# Patient Record
Sex: Male | Born: 2000
Health system: Southern US, Community
[De-identification: ages and names within clinical notes are randomized; demographics above are authoritative.]

## PROBLEM LIST (undated history)

## (undated) DIAGNOSIS — R519 Headache, unspecified: Secondary | ICD-10-CM

## (undated) HISTORY — DX: Headache, unspecified: R51.9

---
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
## 2021-06-21 ENCOUNTER — Encounter: Payer: BC Managed Care – PPO | Admitting: Medical-Surgical

## 2021-09-28 ENCOUNTER — Ambulatory Visit: Payer: BC Managed Care – PPO | Admitting: Physician Assistant

## 2021-09-28 ENCOUNTER — Encounter: Payer: Self-pay | Admitting: Medical-Surgical

## 2021-09-28 ENCOUNTER — Telehealth (INDEPENDENT_AMBULATORY_CARE_PROVIDER_SITE_OTHER): Payer: BC Managed Care – PPO | Admitting: Medical-Surgical

## 2021-09-28 DIAGNOSIS — F418 Other specified anxiety disorders: Secondary | ICD-10-CM

## 2021-09-28 DIAGNOSIS — Z7689 Persons encountering health services in other specified circumstances: Secondary | ICD-10-CM

## 2021-09-28 MED ORDER — BUPROPION HCL ER (XL) 150 MG PO TB24: 150.0000 mg | ORAL_TABLET | ORAL | 0 refills | Status: DC

## 2021-09-28 NOTE — Progress Notes (Signed)
Virtual Visit via Video Note  I connected with Jacob Thompson on 09/28/21 at 11:10 AM EDT by a video enabled telemedicine application and verified that I am speaking with the correct person using two identifiers.   I discussed the limitations of evaluation and management by telemedicine and the availability of in person appointments. The patient expressed understanding and agreed to proceed.  Patient location: home Provider locations: office  Subjective:    CC: Transfer of care, discussed mood  HPI: Very pleasant 21 year old male presenting today via [place] video visit to transfer care to a new PCP and to discuss concerns with anxiety/depression.  He was treated in the past with fluoxetine 10 mg daily however he notes that he probably did not take this long enough to make any difference.  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  Notes that there are times when he does feel overwhelmed and he is struggling a little bit with the transition to college life.  Feels that his symptoms are more on the depression side as he has little interest and pleasure in doing things, poor appetite, and sleep that is not as satisfying as he would prefer.  Notes that he has counseling available at his college and he has had a couple of appointments that he found helpful.  His very busy schedule does interfere with his ability to go to counseling sessions so he has not been more often.  He is interested in starting medication that may help with his symptoms.  Denies SI/HI.  Of note he also has a history of ADHD and reports that he used to be treated with Adderall.  He felt that the medication worked however as he got older, he noted a natural improvement in his ability to focus and get things done.  He stopped taking the medication and is doing well overall.  Past medical history, Surgical history, Family history not pertinant except as noted below, Social history,  Allergies, and medications have been entered into the medical record, reviewed, and corrections made.   Review of Systems: See HPI for pertinent positives and negatives.      09/28/2021   10:49 AM 09/28/2021   10:48 AM 09/28/2021   10:43 AM 01/20/2020   11:16 AM 04/13/2019    2:06 PM  Depression screen PHQ 2/9  Decreased Interest 2   1 0  Down, Depressed, Hopeless 2   1 0  PHQ - 2 Score 4   2 0  Altered sleeping 1 1     Tired, decreased energy 2 2     Change in appetite 1 1 1     Feeling bad or failure about yourself  2 2     Trouble concentrating 1 1 1     Moving slowly or fidgety/restless 2 2     Suicidal thoughts 0 0     PHQ-9 Score 13      Difficult doing work/chores Somewhat difficult          09/28/2021   10:49 AM  GAD 7 : Generalized Anxiety Score  Nervous, Anxious, on Edge 2  Control/stop worrying 1  Worry too much - different things 1  Trouble relaxing 1  Restless 1  Easily annoyed or irritable 0  Afraid - awful might happen 0  Total GAD 7 Score 6  Anxiety Difficulty Somewhat difficult   Objective:    General: Speaking clearly in complete sentences without any shortness of breath.  Alert and oriented x3.  Normal judgment. No apparent acute distress.  Impression and Recommendations:    1. Encounter to establish care Reviewed available information and discussed care concerns with patient.   2. Depression with anxiety Discussed various options for management of mood issues.  He does seem to be struggling with the transition into a new role in life as well as the responsibilities of college and playing sports.  Reviewed using fluoxetine/Lexapro as a daily medication as he took this in the past.  Also reviewed the option for Wellbutrin as this seems to be a good option for him.  After reviewing use of the potential side effects and expectations of the medication, he would like to start Wellbutrin.  Sending in 150 mg daily.  Advised that this medication may cause an  increase in his anxiety over the next couple of weeks but this should slowly resolve.  If he should have any concerns arise between now and his next follow-up, advised him to reach out to [place] immediately.  Would strongly recommend continuing counseling since this has been beneficial.   I discussed the assessment and treatment plan with the patient. The patient was provided an opportunity to ask questions and all were answered. The patient agreed with the plan and demonstrated an understanding of the instructions.   The patient was advised to call back or seek an in-person evaluation if the symptoms worsen or if the condition fails to improve as anticipated.  25 minutes of non-face-to-face time was provided during this encounter.  Return for Mood follow-up in 4-6 weeks okay to be virtual.  [name], DNP, APRN, FNP-BC [facility] and Sports Medicine

## 2021-11-09 ENCOUNTER — Ambulatory Visit: Payer: BC Managed Care – PPO | Admitting: Medical-Surgical

## 2021-11-20 ENCOUNTER — Telehealth (INDEPENDENT_AMBULATORY_CARE_PROVIDER_SITE_OTHER): Payer: BC Managed Care – PPO | Admitting: Medical-Surgical

## 2021-11-20 ENCOUNTER — Encounter: Payer: Self-pay | Admitting: Medical-Surgical

## 2021-11-20 DIAGNOSIS — F418 Other specified anxiety disorders: Secondary | ICD-10-CM

## 2021-11-20 NOTE — Progress Notes (Signed)
Virtual Visit via Video Note  I connected with Jacob Thompson on 11/20/21 at 10:30 AM EST by a video enabled telemedicine application and verified that I am speaking with the correct person using two identifiers.   I discussed the limitations of evaluation and management by telemedicine and the availability of in person appointments. The patient expressed understanding and agreed to proceed.  Patient location: home Provider locations: office  Subjective:    CC: mood follow up  HPI: Pleasant 21 year old male presenting via MyChart video visit to follow-up on mood.  Over the last 4-5 weeks he has been taking Wellbutrin 150 mg daily, tolerating well without side effects.  Feels that the medication has not been helpful and has not noticed a difference in his symptoms.  He continues to have significant issues with depression as well as some anxiety.  He is doing counseling with college 2-3 times weekly which she reports is very beneficial.  He does endorse continued thoughts of self-harm however reports no plan or intention to follow through with these thoughts.  Is unable to identify a particular trigger for thoughts of self-harm as it happens throughout the day every day during normal activities. Denies homicidal ideation.  Past medical history, Surgical history, Family history not pertinant except as noted below, Social history, Allergies, and medications have been entered into the medical record, reviewed, and corrections made.   Review of Systems: See HPI for pertinent positives and negatives.   Objective:    General: Speaking clearly in complete sentences without any shortness of breath.  Alert and oriented x3.  Normal judgment. No apparent acute distress.  Impression and Recommendations:    1. Depression with anxiety Discussed recommendations for treatment and options available.  He would like to adjust the Wellbutrin so increasing his dose to 300 mg daily.  New supply sent to the  pharmacy but advised that he can take 2 of the 150 mg tablets to use the supply he has on hand.  Discussed possibly adding an SSRI but he would like to see how the Wellbutrin does before committing to adding a second agent.  Recommend continuing counseling.  Patient advised to monitor for any worsening thoughts of self-harm or the development of a plan to follow through and notify our office as well as his counselor immediately.  I discussed the assessment and treatment plan with the patient. The patient was provided an opportunity to ask questions and all were answered. The patient agreed with the plan and demonstrated an understanding of the instructions.   The patient was advised to call back or seek an in-person evaluation if the symptoms worsen or if the condition fails to improve as anticipated.  25 minutes of non-face-to-face time was provided during this encounter.  Return in about 6 weeks (around 01/01/2022) for mood follow up (virtual is fine).  I will send him a MyChart message in about 3 weeks to touch base and see how symptoms are going on the new dose of Wellbutrin.  [name], DNP, APRN, FNP-BC [facility] and Sports Medicine

## 2021-11-23 ENCOUNTER — Telehealth: Payer: BC Managed Care – PPO | Admitting: Medical-Surgical

## 2021-12-12 ENCOUNTER — Encounter: Payer: Self-pay | Admitting: Medical-Surgical

## 2021-12-19 ENCOUNTER — Other Ambulatory Visit: Payer: Self-pay | Admitting: Medical-Surgical

## 2021-12-30 IMAGING — DX DG KNEE COMPLETE 4+V*R*
4 series · 4 of 4 positions shown · non-contrast
Comparison: None.

CLINICAL DATA: Chronic bilateral knee pain

EXAM:
RIGHT KNEE - COMPLETE 4+ VIEW

[knee lat]
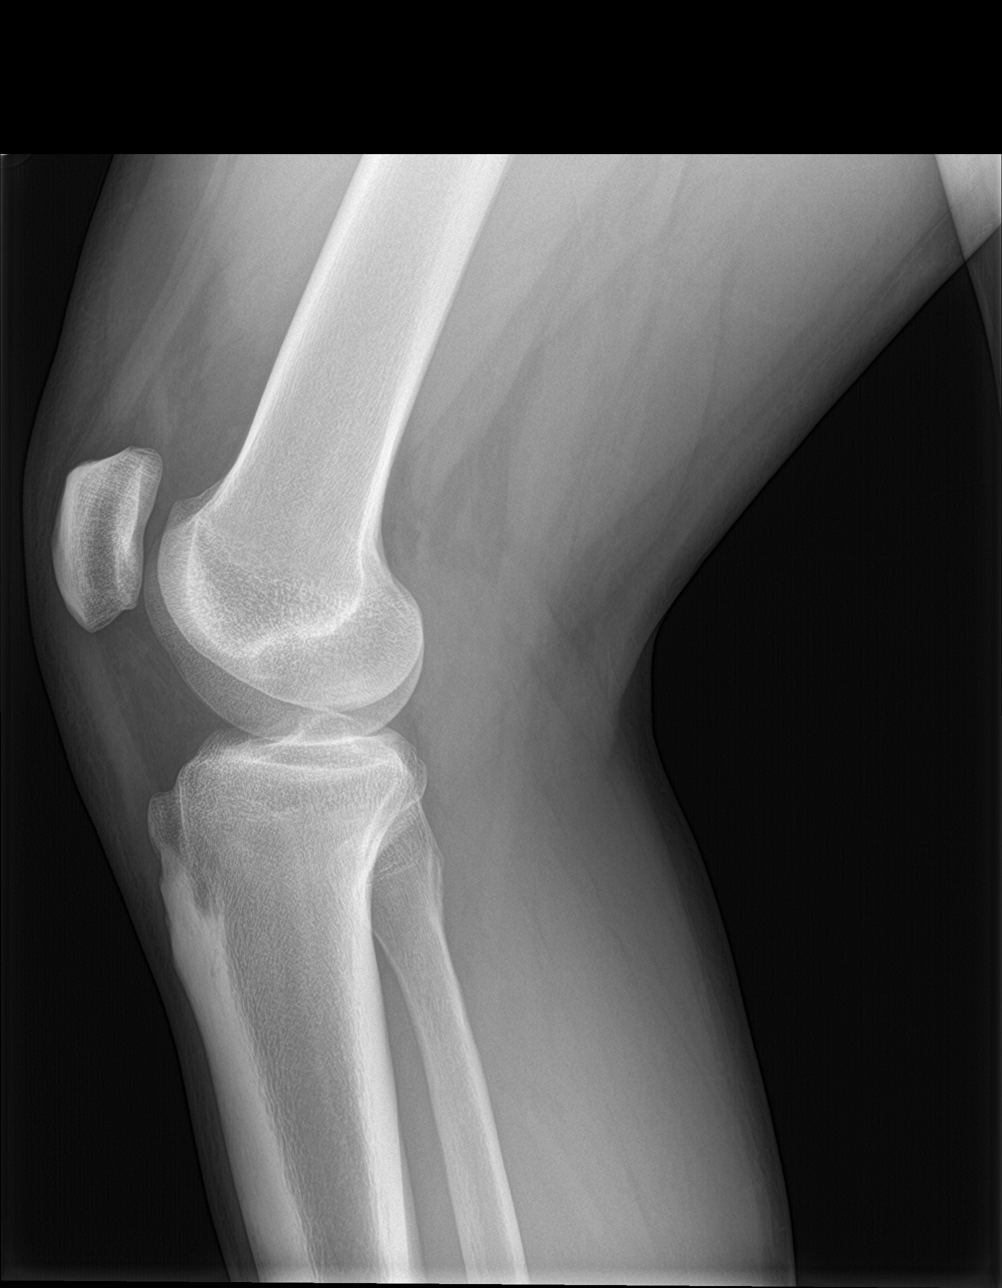

[knee sunrise]
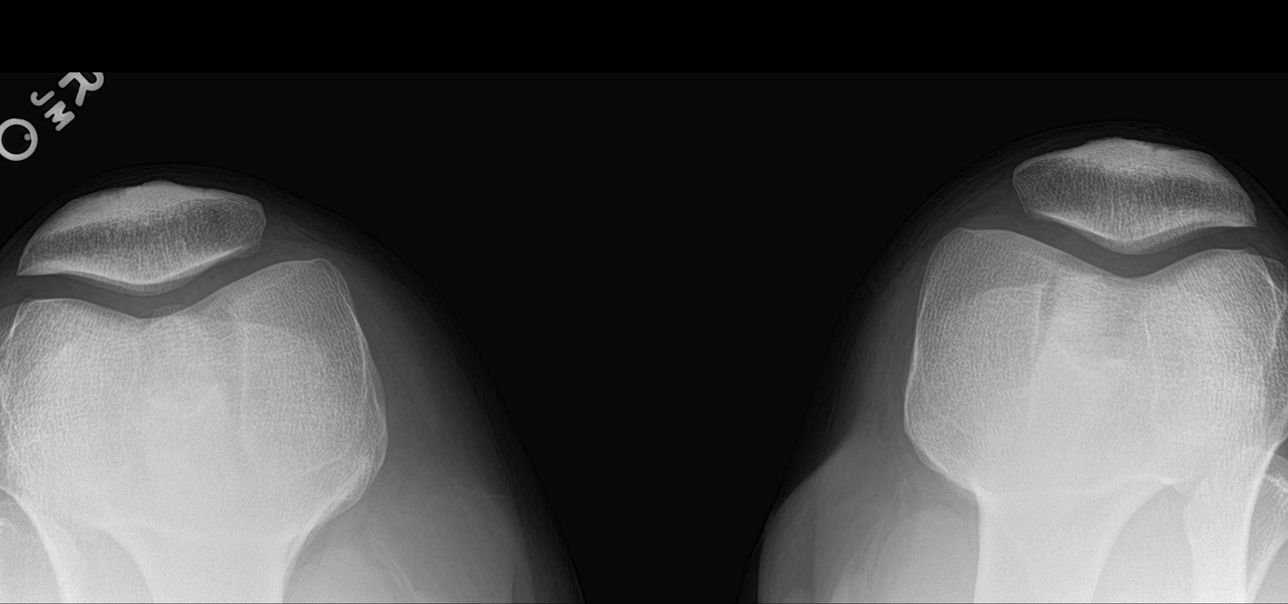

[knee ap bilat standing (1 of 2)]
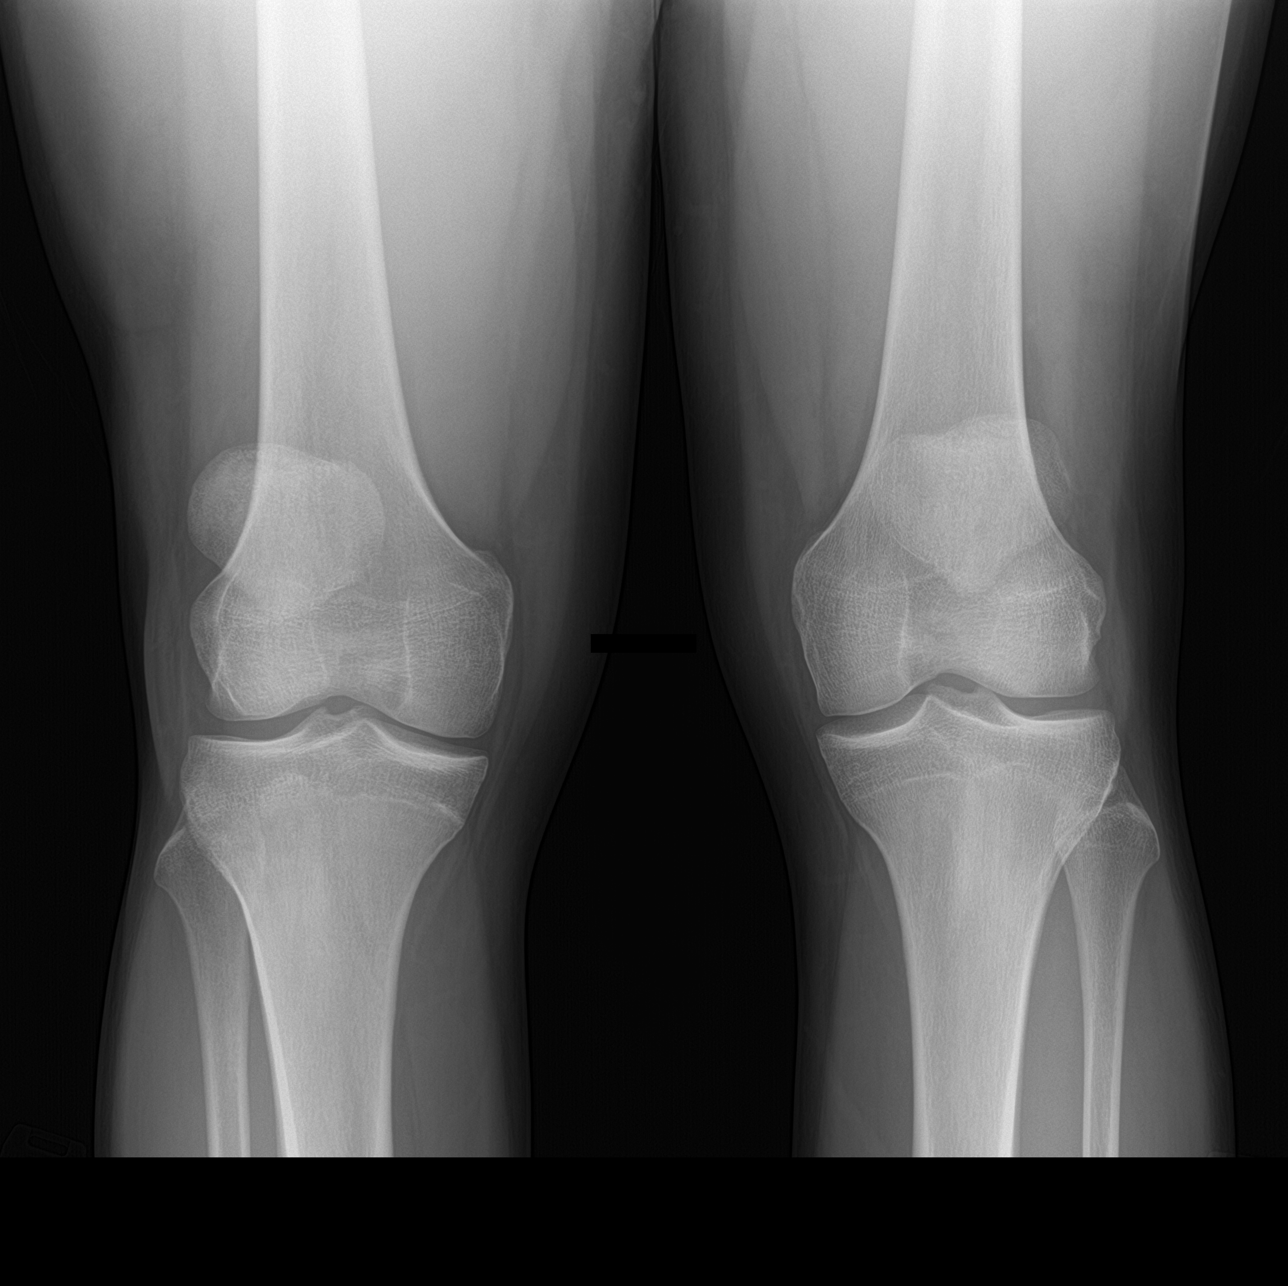

[knee ap bilat standing (2 of 2)]
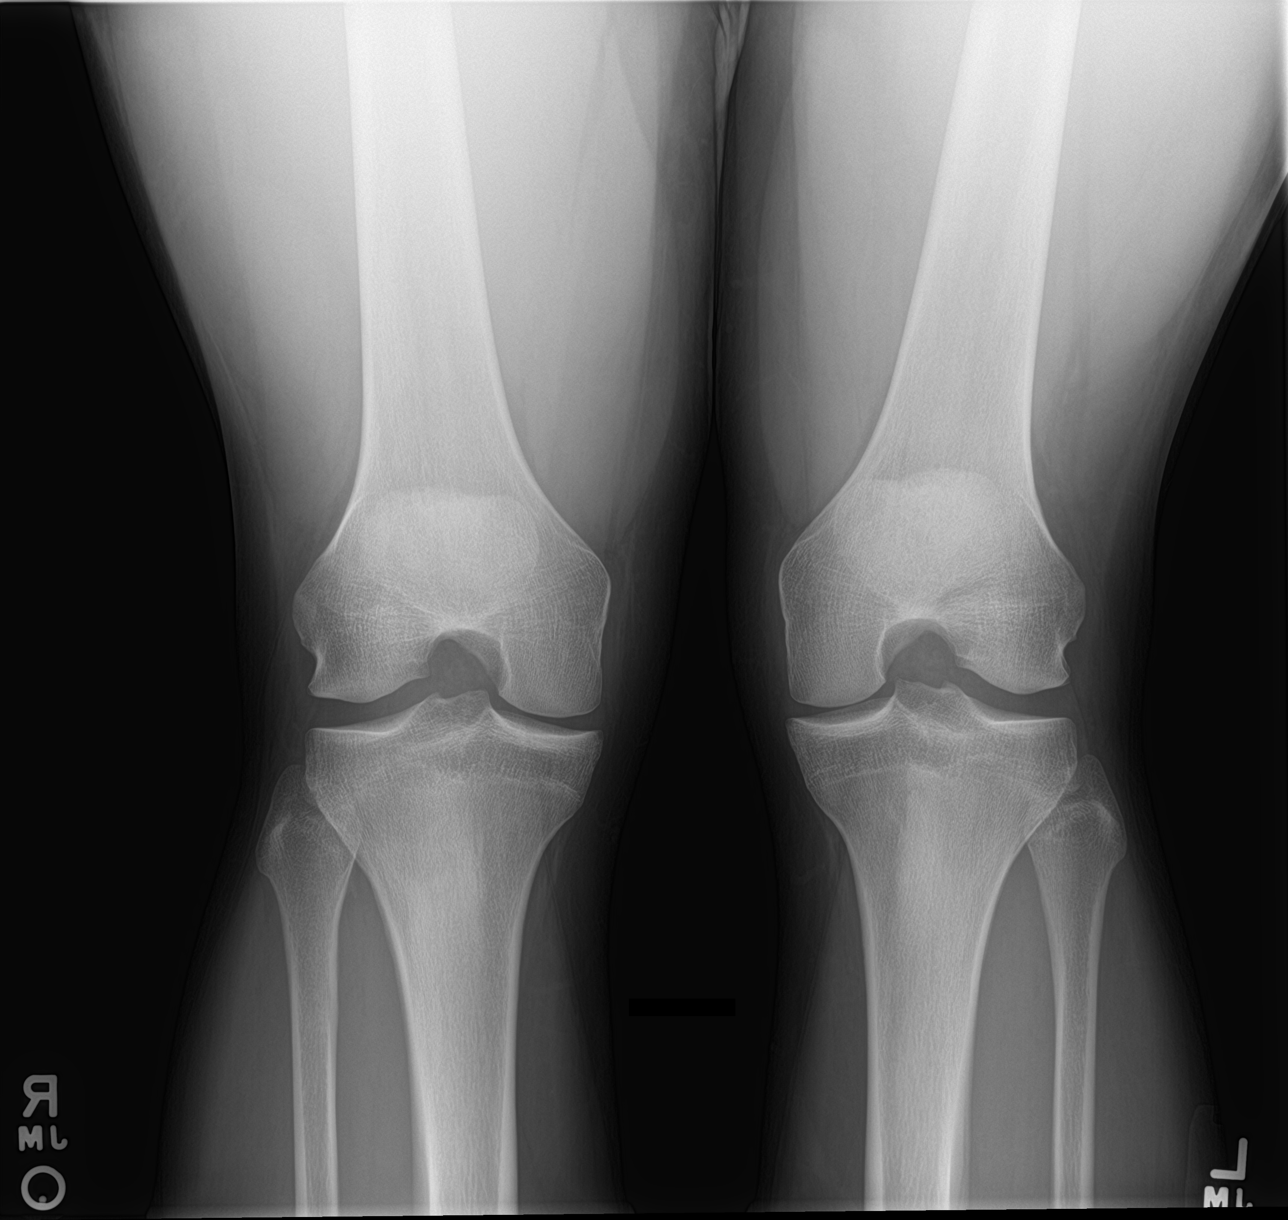

[4 of 4 positions shown; findings below may reference images not displayed]

FINDINGS: No evidence of fracture, dislocation, or joint effusion. No evidence
of arthropathy or other focal bone abnormality. Soft tissues are
unremarkable.
IMPRESSION: Negative.

## 2022-01-11 ENCOUNTER — Other Ambulatory Visit: Payer: Self-pay

## 2022-01-11 MED ORDER — BUPROPION HCL ER (XL) 150 MG PO TB24: 150.0000 mg | ORAL_TABLET | ORAL | 0 refills | Status: DC

## 2022-03-29 ENCOUNTER — Encounter: Payer: Self-pay | Admitting: Medical-Surgical

## 2022-03-29 ENCOUNTER — Telehealth (INDEPENDENT_AMBULATORY_CARE_PROVIDER_SITE_OTHER): Payer: BC Managed Care – PPO | Admitting: Medical-Surgical

## 2022-03-29 DIAGNOSIS — Z91199 Patient's noncompliance with other medical treatment and regimen due to unspecified reason: Secondary | ICD-10-CM

## 2022-03-29 NOTE — Progress Notes (Signed)
No show for appointment.   Patient called for check in at 8:44am, no answer, voicemail not set up. Patient called again for check in at 9:13am, no answer, unable to leave voicemail.  Provider joined virtual space at 9:18am, text link sent. Patient did not join the virtual space. Provider signed off at 9:33am.   ___________________________________________ Clearnce Sorrel, DNP, APRN, FNP-BC Primary Care and [place]

## 2022-04-17 ENCOUNTER — Other Ambulatory Visit: Payer: Self-pay | Admitting: Medical-Surgical

## 2022-04-22 ENCOUNTER — Encounter: Payer: Self-pay | Admitting: *Deleted

## 2022-06-18 ENCOUNTER — Telehealth: Payer: Self-pay

## 2022-06-18 NOTE — Telephone Encounter (Signed)
LVM for patient to call back [phone], or to call PCP office to schedule follow up apt. AS, CMA  

## 2023-04-09 ENCOUNTER — Telehealth: Payer: Self-pay

## 2023-04-09 NOTE — Telephone Encounter (Signed)
 Copied from CRM [id number]. Topic: Medical Record Request - Records Request >> Apr 09, 2023 11:28 AM Irine Seal wrote: Reason for CRM: patient's mother Jacob Thompson calling to request  Immunization Summary for his college requirements. Her callback number is [phone]

## 2023-04-09 NOTE — Telephone Encounter (Signed)
 Task completed. Patient's mother Lamount Cranker) was notified that a copy of the immunization will be available for pick up at the front desk.

## 2023-06-11 ENCOUNTER — Encounter: Payer: Self-pay | Admitting: Medical-Surgical

## 2023-07-27 ENCOUNTER — Encounter: Payer: Self-pay | Admitting: Medical-Surgical

## 2023-08-04 ENCOUNTER — Ambulatory Visit (INDEPENDENT_AMBULATORY_CARE_PROVIDER_SITE_OTHER): Payer: Self-pay | Admitting: Medical-Surgical

## 2023-08-04 VITALS — BP 104/74 | HR 67 | Resp 20 | Ht 72.57 in | Wt 238.8 lb

## 2023-08-04 DIAGNOSIS — F418 Other specified anxiety disorders: Secondary | ICD-10-CM

## 2023-08-04 DIAGNOSIS — Z23 Encounter for immunization: Secondary | ICD-10-CM

## 2023-08-04 NOTE — Progress Notes (Signed)
        Established patient visit   History of Present Illness   Discussed the use of AI scribe software for clinical note transcription with the patient, who gave verbal consent to proceed.  History of Present Illness   Jacob Thompson is a 23 year old male who presents for a tetanus shot and routine follow-up.  He experiences increased anxiety, making some activities difficult. He is not on medication for anxiety and is considering resuming treatment on particularly bad days. There are no thoughts of self-harm or harm to others.  Asthma is well-controlled, with occasional issues during high pollen levels. He has not had migraines or absence seizures recently.  He has a history of patellar tendonitis, with occasional discomfort after physical exertion, such as warehouse work or playing basketball.  He has lost weight since college, currently weighing 238.8 pounds, down from 295 pounds. There are no new allergies or systemic symptoms such as fevers, chills, respiratory, cardiac, gastrointestinal, or urinary issues, dizziness, headaches, or syncope.        Physical Exam   Physical Exam Vitals and nursing note reviewed.  Constitutional:      General: He is not in acute distress.    Appearance: Normal appearance. He is not ill-appearing.  HENT:     Head: Normocephalic and atraumatic.  Cardiovascular:     Rate and Rhythm: Normal rate and regular rhythm.     Pulses: Normal pulses.     Heart sounds: Normal heart sounds. No murmur heard.    No friction rub. No gallop.  Pulmonary:     Effort: Pulmonary effort is normal. No respiratory distress.     Breath sounds: Normal breath sounds.  Skin:    General: Skin is warm and dry.  Neurological:     Mental Status: He is alert and oriented to person, place, and time.  Psychiatric:        Mood and Affect: Mood normal.        Behavior: Behavior normal.        Thought Content: Thought content normal.        Judgment: Judgment  normal.     Assessment & Plan   Assessment and Plan    Tetanus vaccination Tetanus vaccination administered. - Ensure tetanus vaccination is up to date.  Anxiety Discussed medication option if anxiety worsens. - Consider medication if symptoms worsen.  Asthma Asthma symptoms triggered by high pollen levels. No current issues. - Monitor symptoms, especially during high pollen seasons.  Patellar tendonitis Symptoms decreased with reduced activity.  General Health Maintenance Blood pressure normal. Routine lab work needed for age 44-21.  - Perform routine lab work as recommended.  Follow-up Annual physicals recommended unless issues arise. Virtual visits available if needed. - Schedule annual physical examination. - Utilize virtual visits if needed.     Follow up   Return in about 1 year (around 08/03/2024) for annual physical exam or sooner if needed.  __________________________________ Zada FREDRIK Palin, DNP, APRN, FNP-BC Primary Care and Sports Medicine [facility] [place]

## 2023-08-08 ENCOUNTER — Ambulatory Visit: Payer: Self-pay | Admitting: Medical-Surgical

## 2023-09-09 ENCOUNTER — Encounter: Payer: Self-pay | Admitting: Sports Medicine

## 2024-08-04 ENCOUNTER — Encounter: Payer: Self-pay | Admitting: Medical-Surgical
